# Patient Record
Sex: Female | Born: 1995 | Race: Asian | Hispanic: No | Marital: Single | Smoking: Former smoker
Health system: Southern US, Community
[De-identification: ages and names within clinical notes are randomized; demographics above are authoritative.]

---

## 2019-01-12 ENCOUNTER — Other Ambulatory Visit: Payer: Self-pay

## 2019-01-12 ENCOUNTER — Emergency Department: Payer: 59

## 2019-01-12 ENCOUNTER — Emergency Department
Admission: EM | Admit: 2019-01-12 | Discharge: 2019-01-13 | Disposition: A | Discharge status: Home / Self Care | Payer: 59 | Attending: Emergency Medicine | Admitting: Emergency Medicine

## 2019-01-12 DIAGNOSIS — S8011XA Contusion of right lower leg, initial encounter: Secondary | ICD-10-CM | POA: Diagnosis not present

## 2019-01-12 DIAGNOSIS — Y998 Other external cause status: Secondary | ICD-10-CM | POA: Insufficient documentation

## 2019-01-12 DIAGNOSIS — Z23 Encounter for immunization: Secondary | ICD-10-CM | POA: Insufficient documentation

## 2019-01-12 DIAGNOSIS — Y9302 Activity, running: Secondary | ICD-10-CM | POA: Diagnosis not present

## 2019-01-12 DIAGNOSIS — Y92488 Other paved roadways as the place of occurrence of the external cause: Secondary | ICD-10-CM | POA: Insufficient documentation

## 2019-01-12 DIAGNOSIS — S6991XA Unspecified injury of right wrist, hand and finger(s), initial encounter: Secondary | ICD-10-CM | POA: Diagnosis present

## 2019-01-12 DIAGNOSIS — S60511A Abrasion of right hand, initial encounter: Secondary | ICD-10-CM | POA: Insufficient documentation

## 2019-01-12 DIAGNOSIS — T07XXXA Unspecified multiple injuries, initial encounter: Secondary | ICD-10-CM

## 2019-01-12 DIAGNOSIS — S5012XA Contusion of left forearm, initial encounter: Secondary | ICD-10-CM | POA: Diagnosis not present

## 2019-01-12 MED ORDER — SODIUM CHLORIDE 0.9 % IV BOLUS: 1000.0000 mL | Freq: Once | INTRAVENOUS | Status: DC
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
## 2019-01-12 NOTE — ED Triage Notes (Signed)
Patient states that she took 600mg  of motrin at 1830hrs today.

## 2019-01-13 LAB — CBC WITH DIFFERENTIAL/PLATELET
Abs Immature Granulocytes: 0.02 10*3/uL (ref 0.00–0.07)
Basophils Absolute: 0.1 10*3/uL (ref 0.0–0.1)
Basophils Relative: 1 %
Eosinophils Absolute: 0.2 10*3/uL (ref 0.0–0.5)
Eosinophils Relative: 2 %
HCT: 41.3 % (ref 36.0–46.0)
Hemoglobin: 14.1 g/dL (ref 12.0–15.0)
Immature Granulocytes: 0 %
Lymphocytes Relative: 29 %
Lymphs Abs: 2.7 10*3/uL (ref 0.7–4.0)
MCH: 30.1 pg (ref 26.0–34.0)
MCHC: 34.1 g/dL (ref 30.0–36.0)
MCV: 88.1 fL (ref 80.0–100.0)
Monocytes Absolute: 0.5 10*3/uL (ref 0.1–1.0)
Monocytes Relative: 6 %
Neutro Abs: 5.7 10*3/uL (ref 1.7–7.7)
Neutrophils Relative %: 62 %
Platelets: 280 10*3/uL (ref 150–400)
RBC: 4.69 MIL/uL (ref 3.87–5.11)
RDW: 12.2 % (ref 11.5–15.5)
WBC: 9.1 10*3/uL (ref 4.0–10.5)
nRBC: 0 % (ref 0.0–0.2)

## 2019-01-13 LAB — COMPREHENSIVE METABOLIC PANEL
ALT: 14 U/L (ref 0–44)
AST: 19 U/L (ref 15–41)
Albumin: 4.4 g/dL (ref 3.5–5.0)
Alkaline Phosphatase: 47 U/L (ref 38–126)
Anion gap: 6 (ref 5–15)
BUN: 18 mg/dL (ref 6–20)
CO2: 24 mmol/L (ref 22–32)
Calcium: 9 mg/dL (ref 8.9–10.3)
Chloride: 106 mmol/L (ref 98–111)
Creatinine, Ser: 0.82 mg/dL (ref 0.44–1.00)
GFR calc Af Amer: >60 mL/min (ref >60)
GFR calc non Af Amer: >60 mL/min (ref >60)
Glucose, Bld: 88 mg/dL (ref 70–99)
Potassium: 3.8 mmol/L (ref 3.5–5.1)
Sodium: 136 mmol/L (ref 135–145)
Total Bilirubin: 0.4 mg/dL (ref 0.3–1.2)
Total Protein: 7.2 g/dL (ref 6.5–8.1)

## 2019-01-13 MED ORDER — TETANUS-DIPHTH-ACELL PERTUSSIS 5-2.5-18.5 LF-MCG/0.5 IM SUSP
0.5000 mL | Freq: Once | INTRAMUSCULAR | Status: AC
  Administered 2019-01-13: 01:00:00 0.5 mL via INTRAMUSCULAR
  Filled 2019-01-13: qty 0.5

## 2019-01-13 MED ORDER — HYDROCODONE-ACETAMINOPHEN 5-325 MG PO TABS: 1.0000 | ORAL_TABLET | Freq: Four times a day (QID) | ORAL | 0 refills | Status: AC | PRN

## 2019-01-13 NOTE — Discharge Instructions (Addendum)
You may continue Ibuprofen as needed for pain.  You may take Norco as needed for more severe pain.  Elevate affected areas and apply ice several times daily to reduce swelling.  Return to the ER for worsening symptoms, persistent vomiting, difficulty breathing, lethargy or other concerns.

## 2019-07-28 ENCOUNTER — Other Ambulatory Visit: Payer: Self-pay

## 2019-07-28 ENCOUNTER — Encounter: Payer: Self-pay | Admitting: Physician Assistant

## 2019-07-28 ENCOUNTER — Ambulatory Visit: Payer: Self-pay | Admitting: Physician Assistant

## 2019-07-28 DIAGNOSIS — Z113 Encounter for screening for infections with a predominantly sexual mode of transmission: Secondary | ICD-10-CM

## 2019-07-28 LAB — WET PREP FOR TRICH, YEAST, CLUE
Trichomonas Exam: NEGATIVE
Yeast Exam: NEGATIVE

## 2019-07-28 NOTE — Progress Notes (Signed)
  [facility] Department STI clinic/screening visit  Subjective:  Angela Finley is a 24 y.o. female being seen today for an STI screening visit. The patient reports they do not have symptoms.  Patient reports that they do not desire a pregnancy in the next year.   They reported they are not interested in discussing contraception today.  Patient's last menstrual period was 06/29/2019 (exact date).   Patient has the following medical conditions:  There are no problems to display for this patient.   Chief Complaint  Patient presents with  . SEXUALLY TRANSMITTED DISEASE    HPI  Patient reports that she is not having any symptoms but would like a screening today.  States that she is moving to [place], [place] on Friday and wants screening prior to leaving.  States that she plans to have PE/pap once she gets to [place] and establishes with PCP.  See flowsheet for further details and programmatic requirements.    The following portions of the patient's history were reviewed and updated as appropriate: allergies, current medications, past medical history, past social history, past surgical history and problem list.  Objective:  There were no vitals filed for this visit.  Physical Exam Constitutional:      General: She is not in acute distress.    Appearance: Normal appearance. She is normal weight.  HENT:     Head: Normocephalic and atraumatic.     Comments: No nits, lice, or hair loss. No cervical, supraclavicular or axillary adenopathy.    Mouth/Throat:     Mouth: Mucous membranes are moist.     Pharynx: Oropharynx is clear. No oropharyngeal exudate or posterior oropharyngeal erythema.  Eyes:     Conjunctiva/sclera: Conjunctivae normal.  Pulmonary:     Effort: Pulmonary effort is normal.  Abdominal:     Palpations: Abdomen is soft. There is no mass.     Tenderness: There is no abdominal tenderness. There is no guarding or rebound.  Genitourinary:    General: Normal vulva.      Rectum: Normal.     Comments: External genitalia/pubic area without nits, lice, edema, erythema, lesions and inguinal adenopathy. Vagina with normal mucosa and discharge. Cervix without visible lesions. Uterus firm, mobile, nt, no masses, no CMT, no adnexal tenderness or fullness. Musculoskeletal:     Cervical back: Neck supple. No tenderness.  Skin:    General: Skin is warm and dry.     Findings: No bruising, erythema, lesion or rash.  Neurological:     Mental Status: She is alert and oriented to person, place, and time.  Psychiatric:        Mood and Affect: Mood normal.        Behavior: Behavior normal.        Thought Content: Thought content normal.        Judgment: Judgment normal.      Assessment and Plan:  [name] is a 24 y.o. female presenting to the [facility] Department for STI screening  1. Screening for STD (sexually transmitted disease) Patient into clinic without symptoms. Rec condoms with all sex. Await test results.  Counseled that RN will call if needs to RTC for treatment once results are back. - WET PREP FOR TRICH, YEAST, CLUE - Gonococcus culture - Chlamydia/Gonorrhea [place] [facility] - HIV [place] [facility] - Syphilis Serology,  Lab - Gonococcus culture     No follow-ups on file.  No future appointments.  Matt Holmes, PA

## 2019-07-28 NOTE — Progress Notes (Signed)
Wet mount reviewed and no intervention required per standing order. Cadan Maggart, RN  

## 2019-08-02 LAB — GONOCOCCUS CULTURE
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
